# Patient Record
Sex: Female | Born: 1950 | Race: Black or African American | Hispanic: No | Marital: Married | Smoking: Never smoker
Health system: Southern US, Community
[De-identification: ages and names within clinical notes are randomized; demographics above are authoritative.]

## PROBLEM LIST (undated history)

## (undated) DIAGNOSIS — M069 Rheumatoid arthritis, unspecified: Secondary | ICD-10-CM

## (undated) DIAGNOSIS — I10 Essential (primary) hypertension: Secondary | ICD-10-CM

## (undated) HISTORY — PX: THYROIDECTOMY: SHX17

---

## 1973-02-28 HISTORY — PX: BREAST EXCISIONAL BIOPSY: SUR124

## 2015-05-11 DIAGNOSIS — I10 Essential (primary) hypertension: Secondary | ICD-10-CM | POA: Diagnosis not present

## 2015-05-11 DIAGNOSIS — E210 Primary hyperparathyroidism: Secondary | ICD-10-CM | POA: Diagnosis not present

## 2015-05-11 DIAGNOSIS — E7800 Pure hypercholesterolemia, unspecified: Secondary | ICD-10-CM | POA: Diagnosis not present

## 2015-06-15 DIAGNOSIS — M7550 Bursitis of unspecified shoulder: Secondary | ICD-10-CM | POA: Diagnosis not present

## 2015-06-15 DIAGNOSIS — I10 Essential (primary) hypertension: Secondary | ICD-10-CM | POA: Diagnosis not present

## 2015-08-03 ENCOUNTER — Other Ambulatory Visit (HOSPITAL_COMMUNITY)
Admission: RE | Admit: 2015-08-03 | Discharge: 2015-08-03 | Disposition: A | Discharge status: Home / Self Care | Payer: Commercial Managed Care - HMO | Source: Ambulatory Visit | Attending: Obstetrics and Gynecology | Admitting: Obstetrics and Gynecology

## 2015-08-03 ENCOUNTER — Other Ambulatory Visit: Payer: Self-pay | Admitting: Obstetrics and Gynecology

## 2015-08-03 DIAGNOSIS — Z1151 Encounter for screening for human papillomavirus (HPV): Secondary | ICD-10-CM | POA: Diagnosis not present

## 2015-08-03 DIAGNOSIS — Z01411 Encounter for gynecological examination (general) (routine) with abnormal findings: Secondary | ICD-10-CM | POA: Insufficient documentation

## 2015-08-03 DIAGNOSIS — M8580 Other specified disorders of bone density and structure, unspecified site: Secondary | ICD-10-CM | POA: Diagnosis not present

## 2015-08-03 DIAGNOSIS — M859 Disorder of bone density and structure, unspecified: Secondary | ICD-10-CM | POA: Diagnosis not present

## 2015-08-03 DIAGNOSIS — N761 Subacute and chronic vaginitis: Secondary | ICD-10-CM | POA: Diagnosis not present

## 2015-08-04 LAB — CYTOLOGY - PAP

## 2015-08-05 DIAGNOSIS — M25519 Pain in unspecified shoulder: Secondary | ICD-10-CM | POA: Diagnosis not present

## 2015-08-05 DIAGNOSIS — R079 Chest pain, unspecified: Secondary | ICD-10-CM | POA: Diagnosis not present

## 2015-08-05 DIAGNOSIS — I10 Essential (primary) hypertension: Secondary | ICD-10-CM | POA: Diagnosis not present

## 2015-08-05 DIAGNOSIS — Z8249 Family history of ischemic heart disease and other diseases of the circulatory system: Secondary | ICD-10-CM | POA: Diagnosis not present

## 2015-08-05 DIAGNOSIS — J029 Acute pharyngitis, unspecified: Secondary | ICD-10-CM | POA: Diagnosis not present

## 2015-08-05 DIAGNOSIS — F411 Generalized anxiety disorder: Secondary | ICD-10-CM | POA: Diagnosis not present

## 2015-08-11 ENCOUNTER — Other Ambulatory Visit: Payer: Self-pay | Admitting: Family Medicine

## 2015-08-11 DIAGNOSIS — Z8249 Family history of ischemic heart disease and other diseases of the circulatory system: Secondary | ICD-10-CM

## 2015-08-12 DIAGNOSIS — N939 Abnormal uterine and vaginal bleeding, unspecified: Secondary | ICD-10-CM | POA: Diagnosis not present

## 2015-08-16 ENCOUNTER — Other Ambulatory Visit: Payer: Commercial Managed Care - HMO

## 2015-08-18 DIAGNOSIS — M25511 Pain in right shoulder: Secondary | ICD-10-CM | POA: Diagnosis not present

## 2015-08-18 DIAGNOSIS — M25512 Pain in left shoulder: Secondary | ICD-10-CM | POA: Diagnosis not present

## 2015-08-18 DIAGNOSIS — M542 Cervicalgia: Secondary | ICD-10-CM | POA: Diagnosis not present

## 2015-08-20 ENCOUNTER — Ambulatory Visit
Admission: RE | Admit: 2015-08-20 | Discharge: 2015-08-20 | Disposition: A | Discharge status: Home / Self Care | Payer: Commercial Managed Care - HMO | Source: Ambulatory Visit | Attending: Family Medicine | Admitting: Family Medicine

## 2015-08-20 DIAGNOSIS — Z8249 Family history of ischemic heart disease and other diseases of the circulatory system: Secondary | ICD-10-CM

## 2015-08-20 DIAGNOSIS — G9389 Other specified disorders of brain: Secondary | ICD-10-CM | POA: Diagnosis not present

## 2015-09-08 DIAGNOSIS — M8589 Other specified disorders of bone density and structure, multiple sites: Secondary | ICD-10-CM | POA: Diagnosis not present

## 2015-09-23 DIAGNOSIS — M25512 Pain in left shoulder: Secondary | ICD-10-CM | POA: Diagnosis not present

## 2015-09-23 DIAGNOSIS — M25511 Pain in right shoulder: Secondary | ICD-10-CM | POA: Diagnosis not present

## 2015-09-23 DIAGNOSIS — M542 Cervicalgia: Secondary | ICD-10-CM | POA: Diagnosis not present

## 2015-12-16 DIAGNOSIS — I10 Essential (primary) hypertension: Secondary | ICD-10-CM | POA: Diagnosis not present

## 2015-12-16 DIAGNOSIS — R0981 Nasal congestion: Secondary | ICD-10-CM | POA: Diagnosis not present

## 2015-12-16 DIAGNOSIS — Z1231 Encounter for screening mammogram for malignant neoplasm of breast: Secondary | ICD-10-CM | POA: Diagnosis not present

## 2015-12-16 DIAGNOSIS — Z1389 Encounter for screening for other disorder: Secondary | ICD-10-CM | POA: Diagnosis not present

## 2015-12-16 DIAGNOSIS — E7800 Pure hypercholesterolemia, unspecified: Secondary | ICD-10-CM | POA: Diagnosis not present

## 2015-12-16 DIAGNOSIS — E210 Primary hyperparathyroidism: Secondary | ICD-10-CM | POA: Diagnosis not present

## 2015-12-16 DIAGNOSIS — Z23 Encounter for immunization: Secondary | ICD-10-CM | POA: Diagnosis not present

## 2015-12-18 ENCOUNTER — Other Ambulatory Visit: Payer: Self-pay | Admitting: Family Medicine

## 2015-12-18 DIAGNOSIS — Z1231 Encounter for screening mammogram for malignant neoplasm of breast: Secondary | ICD-10-CM

## 2015-12-18 DIAGNOSIS — Z1239 Encounter for other screening for malignant neoplasm of breast: Secondary | ICD-10-CM

## 2016-01-08 ENCOUNTER — Ambulatory Visit
Admission: RE | Admit: 2016-01-08 | Discharge: 2016-01-08 | Disposition: A | Discharge status: Home / Self Care | Payer: Commercial Managed Care - HMO | Source: Ambulatory Visit | Attending: Family Medicine | Admitting: Family Medicine

## 2016-01-08 DIAGNOSIS — Z1231 Encounter for screening mammogram for malignant neoplasm of breast: Secondary | ICD-10-CM

## 2016-06-20 ENCOUNTER — Ambulatory Visit
Admission: RE | Admit: 2016-06-20 | Discharge: 2016-06-20 | Disposition: A | Discharge status: Home / Self Care | Payer: Medicare HMO | Source: Ambulatory Visit | Attending: Family Medicine | Admitting: Family Medicine

## 2016-06-20 ENCOUNTER — Other Ambulatory Visit: Payer: Self-pay | Admitting: Family Medicine

## 2016-06-20 DIAGNOSIS — M25551 Pain in right hip: Secondary | ICD-10-CM

## 2016-06-20 DIAGNOSIS — R079 Chest pain, unspecified: Secondary | ICD-10-CM | POA: Diagnosis not present

## 2016-06-20 DIAGNOSIS — E210 Primary hyperparathyroidism: Secondary | ICD-10-CM | POA: Diagnosis not present

## 2016-06-20 DIAGNOSIS — E7800 Pure hypercholesterolemia, unspecified: Secondary | ICD-10-CM | POA: Diagnosis not present

## 2016-06-20 DIAGNOSIS — I10 Essential (primary) hypertension: Secondary | ICD-10-CM | POA: Diagnosis not present

## 2016-07-18 DIAGNOSIS — H5213 Myopia, bilateral: Secondary | ICD-10-CM | POA: Diagnosis not present

## 2016-08-03 DIAGNOSIS — N952 Postmenopausal atrophic vaginitis: Secondary | ICD-10-CM | POA: Diagnosis not present

## 2016-08-03 DIAGNOSIS — M8580 Other specified disorders of bone density and structure, unspecified site: Secondary | ICD-10-CM | POA: Diagnosis not present

## 2016-08-03 DIAGNOSIS — Z62819 Personal history of unspecified abuse in childhood: Secondary | ICD-10-CM | POA: Diagnosis not present

## 2016-08-03 DIAGNOSIS — Z01411 Encounter for gynecological examination (general) (routine) with abnormal findings: Secondary | ICD-10-CM | POA: Diagnosis not present

## 2016-08-05 ENCOUNTER — Emergency Department (HOSPITAL_COMMUNITY)
Admission: EM | Admit: 2016-08-05 | Discharge: 2016-08-05 | Disposition: A | Discharge status: Home / Self Care | Payer: Medicare HMO | Attending: Emergency Medicine | Admitting: Emergency Medicine

## 2016-08-05 ENCOUNTER — Encounter (HOSPITAL_COMMUNITY): Payer: Self-pay | Admitting: Emergency Medicine

## 2016-08-05 ENCOUNTER — Emergency Department (HOSPITAL_COMMUNITY): Payer: Medicare HMO

## 2016-08-05 DIAGNOSIS — I10 Essential (primary) hypertension: Secondary | ICD-10-CM | POA: Diagnosis not present

## 2016-08-05 DIAGNOSIS — G44209 Tension-type headache, unspecified, not intractable: Secondary | ICD-10-CM | POA: Diagnosis not present

## 2016-08-05 DIAGNOSIS — R51 Headache: Secondary | ICD-10-CM | POA: Diagnosis present

## 2016-08-05 DIAGNOSIS — R0789 Other chest pain: Secondary | ICD-10-CM | POA: Diagnosis not present

## 2016-08-05 HISTORY — DX: Rheumatoid arthritis, unspecified: M06.9

## 2016-08-05 HISTORY — DX: Essential (primary) hypertension: I10

## 2016-08-05 LAB — COMPREHENSIVE METABOLIC PANEL
ALBUMIN: 4 g/dL (ref 3.5–5.0)
ALT: 22 U/L (ref 14–54)
AST: 25 U/L (ref 15–41)
Alkaline Phosphatase: 99 U/L (ref 38–126)
Anion gap: 8 (ref 5–15)
BUN: 7 mg/dL (ref 6–20)
CHLORIDE: 106 mmol/L (ref 101–111)
CO2: 24 mmol/L (ref 22–32)
Calcium: 9.8 mg/dL (ref 8.9–10.3)
Creatinine, Ser: 0.81 mg/dL (ref 0.44–1.00)
GFR calc Af Amer: >60 mL/min (ref >60)
Glucose, Bld: 88 mg/dL (ref 65–99)
POTASSIUM: 3.9 mmol/L (ref 3.5–5.1)
Sodium: 138 mmol/L (ref 135–145)
Total Bilirubin: 1.2 mg/dL (ref 0.3–1.2)
Total Protein: 7.2 g/dL (ref 6.5–8.1)

## 2016-08-05 LAB — I-STAT TROPONIN, ED: TROPONIN I, POC: 0.01 ng/mL (ref 0.00–0.08)

## 2016-08-05 MED ORDER — LISINOPRIL 20 MG PO TABS: 20.0000 mg | ORAL_TABLET | Freq: Every day | ORAL | 0 refills | Status: AC

## 2016-08-05 MED ORDER — LISINOPRIL 20 MG PO TABS
20.0000 mg | ORAL_TABLET | Freq: Once | ORAL | Status: AC
  Administered 2016-08-05: 20 mg via ORAL
  Filled 2016-08-05: qty 1

## 2016-08-05 NOTE — ED Notes (Signed)
Pt stable, understands discharge instructions, and reasons for return.   

## 2016-08-05 NOTE — ED Provider Notes (Signed)
[facility]-EMERGENCY DEPT Provider Note   CSN: [id number] Arrival date & time: 08/05/16  1849   History   Chief Complaint Chief Complaint  Patient presents with  . Hypertension  . Headache    HPI Sharon Vincent is a 66 y.o. female.  Patient reports that she went to have a dental procedure done today and her dentist noted her BP to be 180s/90s.   She reports her BP has never been this high.  She reports that when she checked it at home it was 140's but gradually went up upon recheck.  She report associated mild dizziness, frontal headache.  She denies visual disturbance, nausea, vomiting, balance abnormality, numbness/ tingling or weakness.  She reports UOP is normal.  She reports chest heaviness that started in the waiting room.  No SOB.  She reports that she used to be on low dose Lisinopril 20mg  before but she was taken off of it because she had read concerning side effects of Lisinopril, though notes she did not experience side effects personally. She notes she was told she has "white coat syndrome".  She was normotensive until about 3 weeks ago.  She notes that she went to her doctor's office on Thursday and it was normal then.  She denies tobacco, drug use.  Occasional social ETOH use.  She has not taken any OTC cold medicines, including pseudoephedrine.    She has a strong family h/o HTN.  Her mother had a fatal aneurysm in her 30s.  No family h/o MI or CVA.       Past Medical History:  Diagnosis Date  . Hypertension   . Rheumatoid arthritis (HCC)     There are no active problems to display for this patient.   Past Surgical History:  Procedure Laterality Date  . THYROIDECTOMY      OB History    No data available       Home Medications    Prior to Admission medications   Not on File    Family History Family History  Problem Relation Age of Onset  . Aneurysm Mother   . Hypertension Sister   . Hypertension Sister     Social History Social History    Substance Use Topics  . Smoking status: Never Smoker  . Smokeless tobacco: Never Used  . Alcohol use Yes     Comment: occ     Allergies   Patient has no known allergies.   Review of Systems Review of Systems  Constitutional: Negative for activity change.  HENT: Negative for hearing loss, trouble swallowing and voice change.   Eyes: Negative for photophobia and visual disturbance.  Respiratory: Positive for chest tightness. Negative for shortness of breath.   Cardiovascular: Negative for chest pain, palpitations and leg swelling.  Gastrointestinal: Negative for abdominal pain, diarrhea, nausea and vomiting.  Genitourinary: Negative for difficulty urinating.  Musculoskeletal: Negative for gait problem.  Neurological: Positive for dizziness and headaches. Negative for syncope, speech difficulty, weakness and numbness.  Psychiatric/Behavioral: The patient is nervous/anxious.      Physical Exam Updated Vital Signs BP (!) 174/72   Pulse 62   Temp 98.8 F (37.1 C) (Oral)   Resp 17   Ht 5' (1.524 m)   Wt 60.3 kg (133 lb)   SpO2 100%   BMI 25.97 kg/m   Physical Exam  Constitutional: She is oriented to person, place, and time. She appears well-developed and well-nourished. No distress.  HENT:  Head: Normocephalic and atraumatic.  Eyes: Conjunctivae and  EOM are normal. Pupils are equal, round, and reactive to light. No scleral icterus.  Neck: Normal range of motion. Neck supple. No JVD present.  Cardiovascular: Normal rate, regular rhythm, normal heart sounds and intact distal pulses.  Exam reveals no gallop and no friction rub.   No murmur heard. Pulmonary/Chest: Effort normal and breath sounds normal. No respiratory distress.  Abdominal: Soft. Bowel sounds are normal. She exhibits no distension. There is no tenderness.  Musculoskeletal: Normal range of motion. She exhibits no edema.  Neurological: She is alert and oriented to person, place, and time. She has normal  strength. No cranial nerve deficit. Coordination normal.  CN 2-12 grossly in tact  Skin: Skin is warm and dry. Capillary refill takes less than 2 seconds. She is not diaphoretic.  Psychiatric: She has a normal mood and affect. Her behavior is normal. Judgment and thought content normal.  Nursing note and vitals reviewed.  ED Treatments / Results  Labs (all labs ordered are listed, but only abnormal results are displayed) Labs Reviewed  COMPREHENSIVE METABOLIC PANEL  I-STAT TROPOININ, ED    EKG  EKG Interpretation  Date/Time:  Friday August 05 2016 21:18:03 EDT Ventricular Rate:  60 PR Interval:    QRS Duration: 89 QT Interval:  413 QTC Calculation: 413 R Axis:   40 Text Interpretation:  Sinus rhythm Baseline wander in lead(s) V4 No STEMI. No old tracings for comparison.  Confirmed by Alona Bene [id number]) on 08/05/2016 9:30:09 PM       Radiology No results found.  Procedures Procedures (including critical care time)  Medications Ordered in ED Medications  lisinopril (PRINIVIL,ZESTRIL) tablet 20 mg (20 mg Oral Given 08/05/16 2135)     Initial Impression / Assessment and Plan / ED Course  I have reviewed the triage vital signs and the nursing notes.  Pertinent labs & imaging results that were available during my care of the patient were reviewed by me and considered in my medical decision making (see chart for details).     2123: BP 207/96 repeat 211/97. EKG ordered.  CXR, CMP, iTrop ordered.  Patient without focal neurologic deficits.  She is stable and well appearing on room air.  2127: EKG sinus rhythm with no evidence of ischemia.  Home Lisinopril 20mg  PO ordered.    2209: Recheck.  Patient reports headache is improving slightly.  CXR negative for acute processes.  Reviewed this with patient.  BP improving 176/74.  Awaiting lab draw.  2306: iTrop negative.  CMP pending.  2430: CMP unremarkable.  No evidence of end organ damage.  Final Clinical Impressions(s) / ED  Diagnoses   Final diagnoses:  Uncontrolled hypertension  Acute non intractable tension-type headache  Accelerated hypertension   Sharon Vincent is a 66 y.o. female that presents with accelerated hypertension, chest pressure and associated headache.  Her EKG was negative for evidence of ischemia.  CMP unremarkable, iTrop negative.  CXR negative for acute processes.  She was given her home dose of Lisinopril 20mg , to which she responded well.  Blood pressure at discharge was 154/75.  Patient was well appearing and had no further concerns.  Discharge instructions reviewed.  All questions answered.  She was discharged in stable condition home with her husband.  She will follow up with her PCP next week for BP check and repeat BMP following re-initiation of her ACE-I.    New Prescriptions New Prescriptions   LISINOPRIL (PRINIVIL,ZESTRIL) 20 MG TABLET    Take 1 tablet (20 mg total) by mouth  daily.     Raliegh Ip, DO 08/05/16 2339    Maia Plan, MD 08/06/16 1043

## 2016-08-05 NOTE — ED Triage Notes (Signed)
Pt to ED from home c/o hypertension and headache. Pt states she had a dental appointment this morning but they couldn't do the procedure because her BP was very high. She monitored it at home, and it remained above 160 systolic - had been taken off BP medication by her MD. Pt reports having this episode occur recently, with dizziness. Denies dizziness at this time, no N/V, vision changes. Neuro intact.

## 2016-08-05 NOTE — Discharge Instructions (Signed)
You were seen in the ED for high blood pressure.  You had an EKG that was normal.  Your chest xray was normal.  Your labs were normal.  You were restarted on Lisinopril 20mg , which you reported was your previous home dose of blood pressure medication.  I have given you a short supply of this medication to use daily at home.  I recommend that you follow up with your primary care doctor in the next 3-5 days for blood pressure check and repeat labs.

## 2016-08-12 DIAGNOSIS — K30 Functional dyspepsia: Secondary | ICD-10-CM | POA: Diagnosis not present

## 2016-08-12 DIAGNOSIS — I10 Essential (primary) hypertension: Secondary | ICD-10-CM | POA: Diagnosis not present

## 2016-08-12 DIAGNOSIS — K5900 Constipation, unspecified: Secondary | ICD-10-CM | POA: Diagnosis not present

## 2016-08-19 DIAGNOSIS — E663 Overweight: Secondary | ICD-10-CM | POA: Diagnosis not present

## 2016-08-19 DIAGNOSIS — E213 Hyperparathyroidism, unspecified: Secondary | ICD-10-CM | POA: Diagnosis not present

## 2016-08-19 DIAGNOSIS — M8580 Other specified disorders of bone density and structure, unspecified site: Secondary | ICD-10-CM | POA: Diagnosis not present

## 2016-08-19 DIAGNOSIS — M25551 Pain in right hip: Secondary | ICD-10-CM | POA: Diagnosis not present

## 2016-08-19 DIAGNOSIS — Z6826 Body mass index (BMI) 26.0-26.9, adult: Secondary | ICD-10-CM | POA: Diagnosis not present

## 2016-08-19 DIAGNOSIS — M889 Osteitis deformans of unspecified bone: Secondary | ICD-10-CM | POA: Diagnosis not present

## 2016-09-12 DIAGNOSIS — M8580 Other specified disorders of bone density and structure, unspecified site: Secondary | ICD-10-CM | POA: Diagnosis not present

## 2016-09-12 DIAGNOSIS — M25551 Pain in right hip: Secondary | ICD-10-CM | POA: Diagnosis not present

## 2016-09-12 DIAGNOSIS — E213 Hyperparathyroidism, unspecified: Secondary | ICD-10-CM | POA: Diagnosis not present

## 2016-09-12 DIAGNOSIS — M889 Osteitis deformans of unspecified bone: Secondary | ICD-10-CM | POA: Diagnosis not present

## 2016-09-12 DIAGNOSIS — Z6825 Body mass index (BMI) 25.0-25.9, adult: Secondary | ICD-10-CM | POA: Diagnosis not present

## 2016-09-12 DIAGNOSIS — E663 Overweight: Secondary | ICD-10-CM | POA: Diagnosis not present

## 2016-10-04 DIAGNOSIS — N952 Postmenopausal atrophic vaginitis: Secondary | ICD-10-CM | POA: Diagnosis not present

## 2016-10-04 DIAGNOSIS — N941 Unspecified dyspareunia: Secondary | ICD-10-CM | POA: Diagnosis not present

## 2016-11-29 ENCOUNTER — Other Ambulatory Visit: Payer: Self-pay | Admitting: Family Medicine

## 2016-11-29 DIAGNOSIS — Z1231 Encounter for screening mammogram for malignant neoplasm of breast: Secondary | ICD-10-CM

## 2016-12-20 DIAGNOSIS — Z23 Encounter for immunization: Secondary | ICD-10-CM | POA: Diagnosis not present

## 2016-12-20 DIAGNOSIS — E7800 Pure hypercholesterolemia, unspecified: Secondary | ICD-10-CM | POA: Diagnosis not present

## 2016-12-20 DIAGNOSIS — I10 Essential (primary) hypertension: Secondary | ICD-10-CM | POA: Diagnosis not present

## 2016-12-20 DIAGNOSIS — E210 Primary hyperparathyroidism: Secondary | ICD-10-CM | POA: Diagnosis not present

## 2016-12-20 DIAGNOSIS — Z1389 Encounter for screening for other disorder: Secondary | ICD-10-CM | POA: Diagnosis not present

## 2017-01-13 ENCOUNTER — Ambulatory Visit
Admission: RE | Admit: 2017-01-13 | Discharge: 2017-01-13 | Disposition: A | Discharge status: Home / Self Care | Payer: Medicare HMO | Source: Ambulatory Visit | Attending: Family Medicine | Admitting: Family Medicine

## 2017-01-13 DIAGNOSIS — Z1231 Encounter for screening mammogram for malignant neoplasm of breast: Secondary | ICD-10-CM

## 2017-03-08 DIAGNOSIS — J329 Chronic sinusitis, unspecified: Secondary | ICD-10-CM | POA: Diagnosis not present

## 2017-03-15 DIAGNOSIS — E663 Overweight: Secondary | ICD-10-CM | POA: Diagnosis not present

## 2017-03-15 DIAGNOSIS — Z6826 Body mass index (BMI) 26.0-26.9, adult: Secondary | ICD-10-CM | POA: Diagnosis not present

## 2017-03-15 DIAGNOSIS — R0981 Nasal congestion: Secondary | ICD-10-CM | POA: Diagnosis not present

## 2017-03-15 DIAGNOSIS — E213 Hyperparathyroidism, unspecified: Secondary | ICD-10-CM | POA: Diagnosis not present

## 2017-03-15 DIAGNOSIS — M889 Osteitis deformans of unspecified bone: Secondary | ICD-10-CM | POA: Diagnosis not present

## 2017-03-15 DIAGNOSIS — M25512 Pain in left shoulder: Secondary | ICD-10-CM | POA: Diagnosis not present

## 2017-03-17 DIAGNOSIS — H43811 Vitreous degeneration, right eye: Secondary | ICD-10-CM | POA: Diagnosis not present

## 2017-04-21 DIAGNOSIS — H43813 Vitreous degeneration, bilateral: Secondary | ICD-10-CM | POA: Diagnosis not present

## 2017-04-21 DIAGNOSIS — H2513 Age-related nuclear cataract, bilateral: Secondary | ICD-10-CM | POA: Diagnosis not present

## 2017-04-21 DIAGNOSIS — H524 Presbyopia: Secondary | ICD-10-CM | POA: Diagnosis not present

## 2017-04-21 DIAGNOSIS — H5213 Myopia, bilateral: Secondary | ICD-10-CM | POA: Diagnosis not present

## 2017-06-01 DIAGNOSIS — E7800 Pure hypercholesterolemia, unspecified: Secondary | ICD-10-CM | POA: Diagnosis not present

## 2017-06-01 DIAGNOSIS — M889 Osteitis deformans of unspecified bone: Secondary | ICD-10-CM | POA: Diagnosis not present

## 2017-06-01 DIAGNOSIS — E210 Primary hyperparathyroidism: Secondary | ICD-10-CM | POA: Diagnosis not present

## 2017-06-01 DIAGNOSIS — I10 Essential (primary) hypertension: Secondary | ICD-10-CM | POA: Diagnosis not present

## 2017-06-07 DIAGNOSIS — I10 Essential (primary) hypertension: Secondary | ICD-10-CM | POA: Diagnosis not present

## 2017-07-06 DIAGNOSIS — H9209 Otalgia, unspecified ear: Secondary | ICD-10-CM | POA: Diagnosis not present

## 2017-08-07 DIAGNOSIS — N952 Postmenopausal atrophic vaginitis: Secondary | ICD-10-CM | POA: Diagnosis not present

## 2017-08-07 DIAGNOSIS — Z01411 Encounter for gynecological examination (general) (routine) with abnormal findings: Secondary | ICD-10-CM | POA: Diagnosis not present

## 2017-08-07 DIAGNOSIS — M8589 Other specified disorders of bone density and structure, multiple sites: Secondary | ICD-10-CM | POA: Diagnosis not present

## 2017-09-14 DIAGNOSIS — M8588 Other specified disorders of bone density and structure, other site: Secondary | ICD-10-CM | POA: Diagnosis not present

## 2017-10-04 DIAGNOSIS — M85861 Other specified disorders of bone density and structure, right lower leg: Secondary | ICD-10-CM | POA: Diagnosis not present

## 2017-12-05 ENCOUNTER — Other Ambulatory Visit: Payer: Self-pay | Admitting: Family Medicine

## 2017-12-05 DIAGNOSIS — Z1231 Encounter for screening mammogram for malignant neoplasm of breast: Secondary | ICD-10-CM

## 2017-12-22 DIAGNOSIS — I10 Essential (primary) hypertension: Secondary | ICD-10-CM | POA: Diagnosis not present

## 2017-12-22 DIAGNOSIS — E7800 Pure hypercholesterolemia, unspecified: Secondary | ICD-10-CM | POA: Diagnosis not present

## 2017-12-22 DIAGNOSIS — E210 Primary hyperparathyroidism: Secondary | ICD-10-CM | POA: Diagnosis not present

## 2017-12-22 DIAGNOSIS — Z0000 Encounter for general adult medical examination without abnormal findings: Secondary | ICD-10-CM | POA: Diagnosis not present

## 2017-12-22 DIAGNOSIS — M889 Osteitis deformans of unspecified bone: Secondary | ICD-10-CM | POA: Diagnosis not present

## 2017-12-22 DIAGNOSIS — Z23 Encounter for immunization: Secondary | ICD-10-CM | POA: Diagnosis not present

## 2018-01-17 ENCOUNTER — Ambulatory Visit
Admission: RE | Admit: 2018-01-17 | Discharge: 2018-01-17 | Disposition: A | Discharge status: Home / Self Care | Payer: Medicare HMO | Source: Ambulatory Visit | Attending: Family Medicine | Admitting: Family Medicine

## 2018-01-17 DIAGNOSIS — Z1231 Encounter for screening mammogram for malignant neoplasm of breast: Secondary | ICD-10-CM | POA: Diagnosis not present

## 2018-01-19 ENCOUNTER — Other Ambulatory Visit: Payer: Self-pay | Admitting: Family Medicine

## 2018-01-19 DIAGNOSIS — R928 Other abnormal and inconclusive findings on diagnostic imaging of breast: Secondary | ICD-10-CM

## 2018-01-29 ENCOUNTER — Ambulatory Visit
Admission: RE | Admit: 2018-01-29 | Discharge: 2018-01-29 | Disposition: A | Discharge status: Home / Self Care | Payer: Medicare HMO | Source: Ambulatory Visit | Attending: Family Medicine | Admitting: Family Medicine

## 2018-01-29 ENCOUNTER — Ambulatory Visit: Payer: Medicare HMO

## 2018-01-29 DIAGNOSIS — R928 Other abnormal and inconclusive findings on diagnostic imaging of breast: Secondary | ICD-10-CM

## 2018-01-29 DIAGNOSIS — R922 Inconclusive mammogram: Secondary | ICD-10-CM | POA: Diagnosis not present

## 2018-03-15 DIAGNOSIS — E213 Hyperparathyroidism, unspecified: Secondary | ICD-10-CM | POA: Diagnosis not present

## 2018-03-15 DIAGNOSIS — M25551 Pain in right hip: Secondary | ICD-10-CM | POA: Diagnosis not present

## 2018-03-15 DIAGNOSIS — M8580 Other specified disorders of bone density and structure, unspecified site: Secondary | ICD-10-CM | POA: Diagnosis not present

## 2018-03-15 DIAGNOSIS — M889 Osteitis deformans of unspecified bone: Secondary | ICD-10-CM | POA: Diagnosis not present

## 2018-03-15 DIAGNOSIS — E663 Overweight: Secondary | ICD-10-CM | POA: Diagnosis not present

## 2018-03-15 DIAGNOSIS — Z6826 Body mass index (BMI) 26.0-26.9, adult: Secondary | ICD-10-CM | POA: Diagnosis not present

## 2018-04-10 DIAGNOSIS — R52 Pain, unspecified: Secondary | ICD-10-CM | POA: Diagnosis not present

## 2018-04-10 DIAGNOSIS — J069 Acute upper respiratory infection, unspecified: Secondary | ICD-10-CM | POA: Diagnosis not present

## 2018-04-10 DIAGNOSIS — J111 Influenza due to unidentified influenza virus with other respiratory manifestations: Secondary | ICD-10-CM | POA: Diagnosis not present

## 2018-04-10 DIAGNOSIS — R05 Cough: Secondary | ICD-10-CM | POA: Diagnosis not present

## 2018-04-27 DIAGNOSIS — H52211 Irregular astigmatism, right eye: Secondary | ICD-10-CM | POA: Diagnosis not present

## 2018-04-27 DIAGNOSIS — H524 Presbyopia: Secondary | ICD-10-CM | POA: Diagnosis not present

## 2018-04-27 DIAGNOSIS — H2513 Age-related nuclear cataract, bilateral: Secondary | ICD-10-CM | POA: Diagnosis not present

## 2018-04-27 DIAGNOSIS — H5212 Myopia, left eye: Secondary | ICD-10-CM | POA: Diagnosis not present

## 2018-06-12 DIAGNOSIS — B373 Candidiasis of vulva and vagina: Secondary | ICD-10-CM | POA: Diagnosis not present

## 2018-06-12 DIAGNOSIS — N952 Postmenopausal atrophic vaginitis: Secondary | ICD-10-CM | POA: Diagnosis not present

## 2018-06-12 DIAGNOSIS — N898 Other specified noninflammatory disorders of vagina: Secondary | ICD-10-CM | POA: Diagnosis not present

## 2020-10-14 IMAGING — [facility] DIGITAL DIAGNOSTIC UNILATERAL RIGHT MAMMOGRAM WITH TOMO AND CAD
4 series · 4 of 12 positions shown · non-contrast
Comparison: Previous exam(s).

CLINICAL DATA: Possible asymmetry in the central right breast
slightly laterally in the craniocaudal projection of a recent
screening mammogram.

EXAM:
DIGITAL DIAGNOSTIC UNILATERAL RIGHT MAMMOGRAM WITH CAD AND TOMO

[R CC synth-2D]
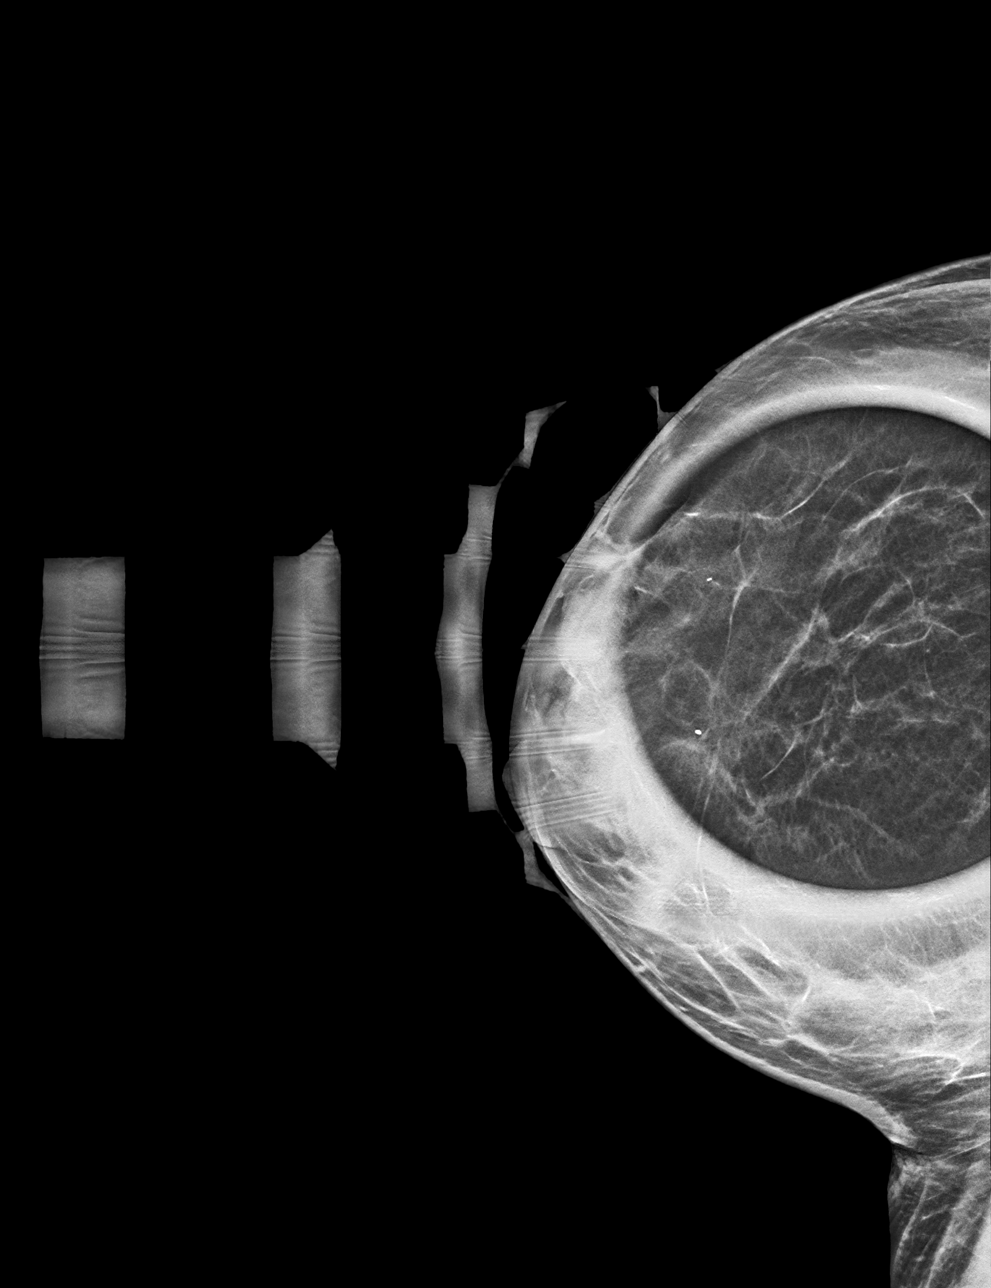

[R ML synth-2D]
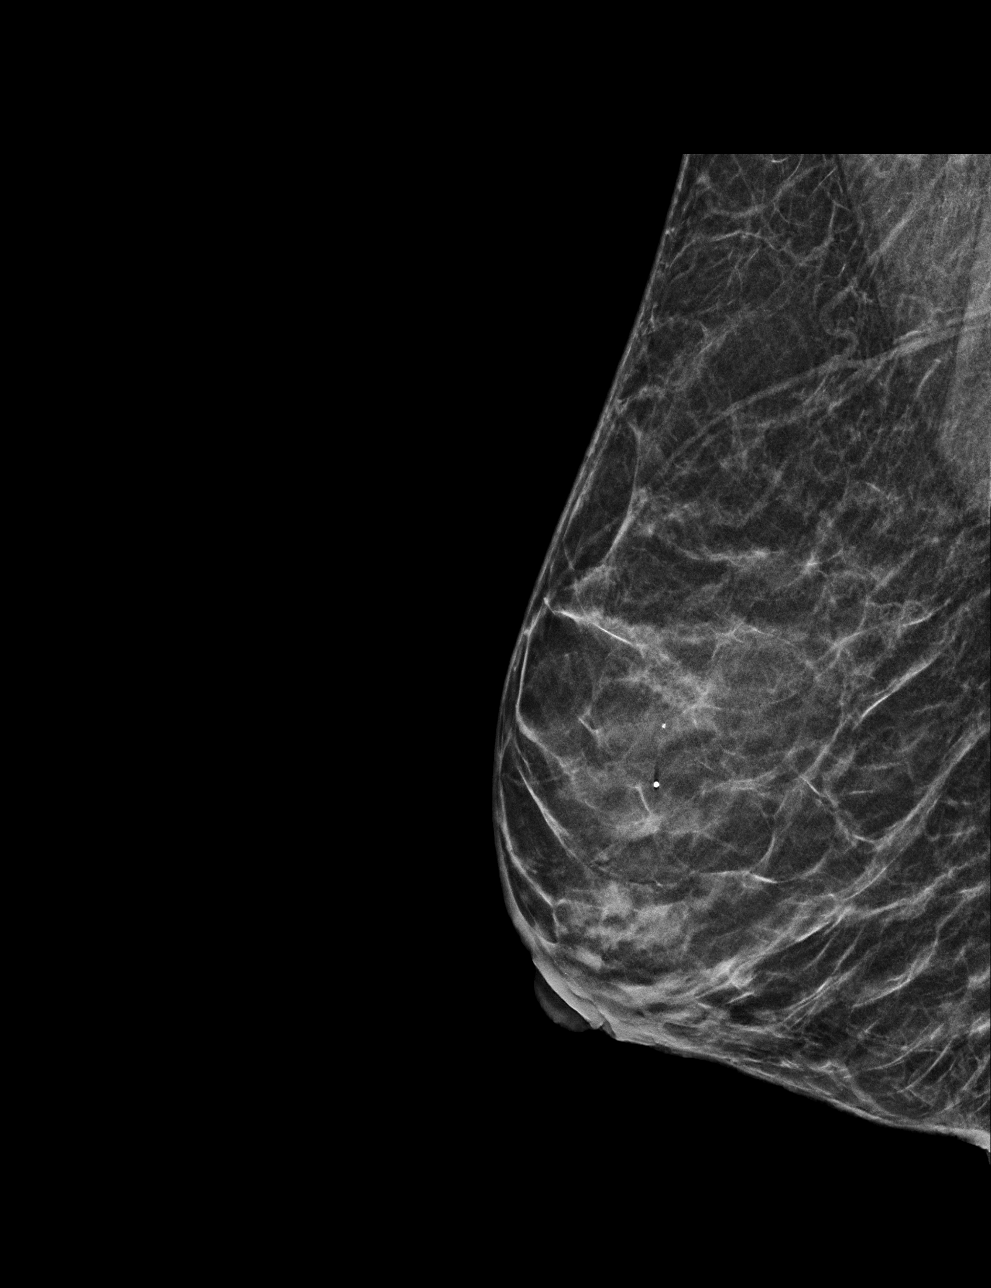

[R CC tomo · tomo slice 17/33.0]
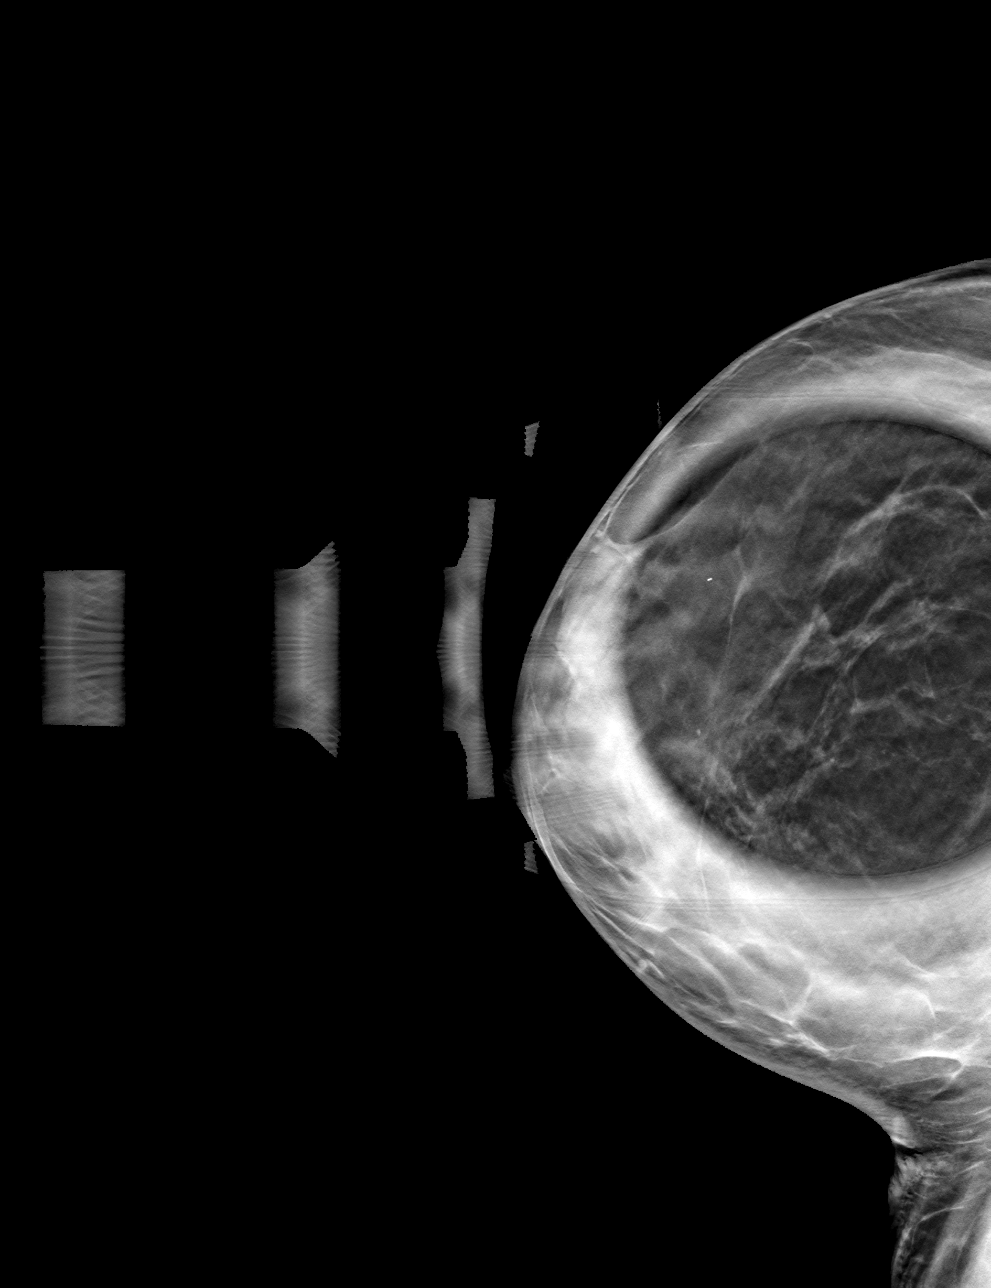

[R ML tomo · tomo slice 19/36.0]
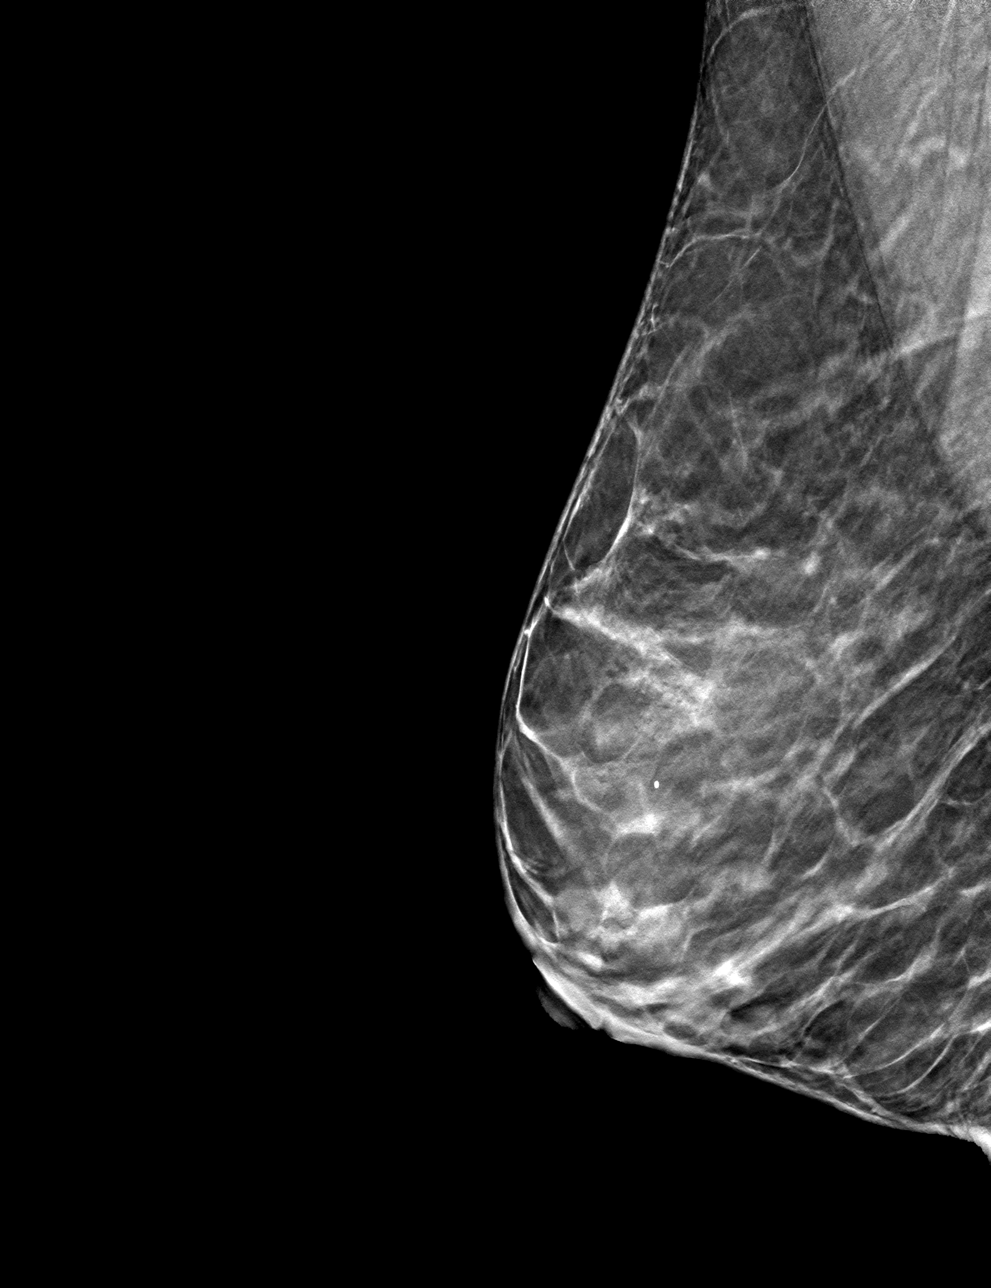

[4 of 12 positions shown; findings below may reference images not displayed]

ACR Breast Density Category c: The breast tissue is heterogeneously
dense, which may obscure small masses.
FINDINGS: 3D tomographic and 2D generated true lateral and spot compression
craniocaudal views of the right breast were obtained. These
demonstrate normal appearing breast tissue at the location of
recently suspected asymmetry.

Mammographic images were processed with CAD.
IMPRESSION: No evidence of malignancy. The recently suspected right breast
asymmetry was close apposition of normal breast tissue.

RECOMMENDATION:
Bilateral screening mammogram in 1 year.

I have discussed the findings and recommendations with the patient.
Results were also provided in writing at the conclusion of the
visit. If applicable, a reminder letter will be sent to the patient
regarding the next appointment.

BI-RADS CATEGORY  1: Negative.
